# Patient Record
Sex: Female | Born: 1965 | Race: White | Hispanic: No | Marital: Married
Health system: Southern US, Community
[De-identification: ages and names within clinical notes are randomized; demographics above are authoritative.]

---

## 1998-05-19 DIAGNOSIS — C50919 Malignant neoplasm of unspecified site of unspecified female breast: Secondary | ICD-10-CM

## 1998-05-19 DIAGNOSIS — Z9221 Personal history of antineoplastic chemotherapy: Secondary | ICD-10-CM

## 1998-05-19 DIAGNOSIS — Z923 Personal history of irradiation: Secondary | ICD-10-CM

## 1998-05-19 HISTORY — DX: Personal history of irradiation: Z92.3

## 1998-05-19 HISTORY — DX: Personal history of antineoplastic chemotherapy: Z92.21

## 1998-05-19 HISTORY — PX: BREAST LUMPECTOMY: SHX2

## 1998-05-19 HISTORY — DX: Malignant neoplasm of unspecified site of unspecified female breast: C50.919

## 2018-05-19 DIAGNOSIS — C4490 Unspecified malignant neoplasm of skin, unspecified: Secondary | ICD-10-CM

## 2018-05-19 HISTORY — DX: Unspecified malignant neoplasm of skin, unspecified: C44.90

## 2019-08-27 ENCOUNTER — Ambulatory Visit: Payer: Self-pay | Attending: Oncology

## 2019-08-27 DIAGNOSIS — Z23 Encounter for immunization: Secondary | ICD-10-CM

## 2019-08-27 NOTE — Progress Notes (Signed)
   Covid-19 Vaccination Clinic  Name:  Megan Valdez    MRN: [id number] DOB: 18-Oct-1965  08/27/2019  Megan Valdez was observed post Covid-19 immunization for 15 minutes without incident. She was provided with Vaccine Information Sheet and instruction to access the V-Safe system.   Megan Valdez was instructed to call 911 with any severe reactions post vaccine: Marland Kitchen Difficulty breathing  . Swelling of face and throat  . A fast heartbeat  . A bad rash all over body  . Dizziness and weakness   Immunizations Administered    Name Date Dose VIS Date Route   Pfizer COVID-19 Vaccine 08/27/2019  1:33 PM 0.3 mL 04/29/2019 Intramuscular   Manufacturer: [place]   Lot: [id number]   [place]: [id number]

## 2019-09-20 ENCOUNTER — Ambulatory Visit: Payer: Self-pay | Attending: Internal Medicine

## 2019-09-20 DIAGNOSIS — Z23 Encounter for immunization: Secondary | ICD-10-CM

## 2019-09-20 NOTE — Progress Notes (Signed)
   Covid-19 Vaccination Clinic  Name:  Quinnlan Moussa    MRN: [id number] DOB: [birth year]  09/20/2019  Ms. Benney was observed post Covid-19 immunization for 15 minutes without incident. She was provided with Vaccine Information Sheet and instruction to access the V-Safe system.   Ms. Curless was instructed to call 911 with any severe reactions post vaccine: Marland Kitchen Difficulty breathing  . Swelling of face and throat  . A fast heartbeat  . A bad rash all over body  . Dizziness and weakness   Immunizations Administered    Name Date Dose VIS Date Route   Pfizer COVID-19 Vaccine 09/20/2019  3:18 PM 0.3 mL 07/13/2018 Intramuscular   Manufacturer: [place]   Lot: [id number]   [place]: [id number]

## 2020-02-01 ENCOUNTER — Encounter: Payer: Self-pay | Admitting: Dermatology

## 2020-02-01 ENCOUNTER — Ambulatory Visit (INDEPENDENT_AMBULATORY_CARE_PROVIDER_SITE_OTHER): Payer: PRIVATE HEALTH INSURANCE | Admitting: Dermatology

## 2020-02-01 ENCOUNTER — Ambulatory Visit: Payer: Self-pay | Admitting: Dermatology

## 2020-02-01 ENCOUNTER — Other Ambulatory Visit: Payer: Self-pay

## 2020-02-01 DIAGNOSIS — Z85828 Personal history of other malignant neoplasm of skin: Secondary | ICD-10-CM

## 2020-02-01 DIAGNOSIS — L821 Other seborrheic keratosis: Secondary | ICD-10-CM

## 2020-02-01 DIAGNOSIS — L905 Scar conditions and fibrosis of skin: Secondary | ICD-10-CM

## 2020-02-01 DIAGNOSIS — Z1283 Encounter for screening for malignant neoplasm of skin: Secondary | ICD-10-CM | POA: Diagnosis not present

## 2020-02-01 DIAGNOSIS — L820 Inflamed seborrheic keratosis: Secondary | ICD-10-CM

## 2020-02-01 DIAGNOSIS — L814 Other melanin hyperpigmentation: Secondary | ICD-10-CM

## 2020-02-01 DIAGNOSIS — D1800 Hemangioma unspecified site: Secondary | ICD-10-CM

## 2020-02-01 DIAGNOSIS — L578 Other skin changes due to chronic exposure to nonionizing radiation: Secondary | ICD-10-CM

## 2020-02-01 DIAGNOSIS — D229 Melanocytic nevi, unspecified: Secondary | ICD-10-CM | POA: Diagnosis not present

## 2020-02-01 NOTE — Progress Notes (Signed)
New Patient Visit  Subjective  Megan Valdez is a 54 y.o. female who presents for the following: New Patient (Initial Visit).  Patient here for full body skin exam and skin cancer screening.  She has some spots on the body present for months to years. They are asymptomatic and have not been treated previously.   Patient has no particular areas of concern today. She states that she has a h/o skin cancer just does not know what type just that they were removed from 3 areas on her chest, the last time was about 1 1/2  years ago. Patient states that the areas removed were pink and scaly and were surgically removed in [place].  Patient has a history of breast cancer.  The following portions of the chart were reviewed this encounter and updated as appropriate:  Allergies  Meds  Problems  Med Hx  Surg Hx  Fam Hx      Review of Systems:  No other skin or systemic complaints except as noted in HPI or Assessment and Plan.  Objective  Well appearing patient in no apparent distress; mood and affect are within normal limits.  A full examination was performed including scalp, head, eyes, ears, nose, lips, neck, chest, axillae, abdomen, back, buttocks, bilateral upper extremities, bilateral lower extremities, hands, feet, fingers, toes, fingernails, and toenails. All findings within normal limits unless otherwise noted below.  Objective  Left Breast: Dyspigmented smooth macule or patch.   Objective  Right Chest superior to scar, Right Lateral Breast: Erythematous keratotic or waxy stuck-on papule or plaque.    Assessment & Plan  Scar Left Breast  Scar from Port placement.  Observe.  Inflamed seborrheic keratosis (2) Right Chest superior to scar; Right Lateral Breast  Cryotherapy today Prior to procedure, discussed risks of blister formation, small wound, skin dyspigmentation, or rare scar following cryotherapy.    Destruction of lesion - Right Chest superior to scar, Right  Lateral Breast  Destruction method: cryotherapy   Informed consent: discussed and consent obtained   Lesion destroyed using liquid nitrogen: Yes   Outcome: patient tolerated procedure well with no complications   Post-procedure details: wound care instructions given     Lentigines - Scattered tan macules - Discussed BBL treatment with our aesthetician for cosmetic improvement. - Discussed due to sun exposure - Benign, observe - Call for any changes  Seborrheic Keratoses - Stuck-on, waxy, tan-brown papules and plaques  - Discussed benign etiology and prognosis. - Observe - Call for any changes  Melanocytic Nevi - Tan-brown and/or pink-flesh-colored symmetric macules and papules - Benign appearing on exam today - Observation - Call clinic for new or changing moles - Recommend daily use of broad spectrum spf 30+ sunscreen to sun-exposed areas.   Hemangiomas - Red papules - Discussed benign nature - Observe - Call for any changes  Actinic Damage - diffuse scaly erythematous macules with underlying dyspigmentation - Recommend daily broad spectrum sunscreen SPF 30+ to sun-exposed areas, reapply every 2 hours as needed.  - Call for new or changing lesions.  Skin cancer screening performed today.  History of Skin Cancer (unknown type) - Sites clear. Observe for recurrence.  -No lymphadenopathy  - Call clinic for new or changing lesions.   - Recommend regular skin exams, daily broad-spectrum spf 30+ sunscreen use, and photoprotection.    - Will request records  Return in about 6 months (around 07/31/2020) for TBSE.  I, Donzetta Kohut, CMA, am acting as scribe for Forest Gleason, MD .  Documentation: I have reviewed the above documentation for accuracy and completeness, and I agree with the above.  Forest Gleason, MD

## 2020-02-01 NOTE — Patient Instructions (Addendum)
Recommend daily broad spectrum sunscreen SPF 30+ to sun-exposed areas, reapply every 2 hours as needed. Call for new or changing lesions.  Recommend taking Heliocare sun protection supplement daily in sunny weather for additional sun protection. For maximum protection on the sunniest days, you can take up to 2 capsules of regular Heliocare OR take 1 capsule of Heliocare Ultra. For prolonged exposure (such as a full day in the sun), you can repeat your dose of the supplement 4 hours after your first dose. Heliocare can be purchased at [facility] or at [email or web address].   

## 2020-02-13 ENCOUNTER — Encounter: Payer: Self-pay | Admitting: Dermatology

## 2020-03-15 ENCOUNTER — Ambulatory Visit: Payer: PRIVATE HEALTH INSURANCE | Admitting: Dermatology

## 2020-04-06 ENCOUNTER — Other Ambulatory Visit: Payer: Self-pay | Admitting: Internal Medicine

## 2020-04-16 ENCOUNTER — Other Ambulatory Visit: Payer: Self-pay | Admitting: Internal Medicine

## 2020-04-16 DIAGNOSIS — Z1231 Encounter for screening mammogram for malignant neoplasm of breast: Secondary | ICD-10-CM

## 2020-06-11 ENCOUNTER — Ambulatory Visit
Admission: RE | Admit: 2020-06-11 | Discharge: 2020-06-11 | Disposition: A | Discharge status: Home / Self Care | Payer: 59 | Source: Ambulatory Visit | Attending: Internal Medicine | Admitting: Internal Medicine

## 2020-06-11 ENCOUNTER — Other Ambulatory Visit: Payer: Self-pay

## 2020-06-11 DIAGNOSIS — Z1231 Encounter for screening mammogram for malignant neoplasm of breast: Secondary | ICD-10-CM | POA: Insufficient documentation

## 2020-08-01 ENCOUNTER — Encounter: Payer: PRIVATE HEALTH INSURANCE | Admitting: Dermatology

## 2020-11-15 ENCOUNTER — Ambulatory Visit: Payer: PRIVATE HEALTH INSURANCE | Admitting: Dermatology

## 2020-11-15 ENCOUNTER — Other Ambulatory Visit: Payer: Self-pay

## 2020-11-15 DIAGNOSIS — D229 Melanocytic nevi, unspecified: Secondary | ICD-10-CM

## 2020-11-15 DIAGNOSIS — Z85828 Personal history of other malignant neoplasm of skin: Secondary | ICD-10-CM

## 2020-11-15 DIAGNOSIS — L578 Other skin changes due to chronic exposure to nonionizing radiation: Secondary | ICD-10-CM | POA: Diagnosis not present

## 2020-11-15 DIAGNOSIS — L308 Other specified dermatitis: Secondary | ICD-10-CM | POA: Diagnosis not present

## 2020-11-15 DIAGNOSIS — Z1283 Encounter for screening for malignant neoplasm of skin: Secondary | ICD-10-CM

## 2020-11-15 DIAGNOSIS — L821 Other seborrheic keratosis: Secondary | ICD-10-CM

## 2020-11-15 DIAGNOSIS — L814 Other melanin hyperpigmentation: Secondary | ICD-10-CM

## 2020-11-15 DIAGNOSIS — D1800 Hemangioma unspecified site: Secondary | ICD-10-CM

## 2020-11-15 MED ORDER — TRIAMCINOLONE ACETONIDE 0.1 % EX CREA: 1.0000 "application " | TOPICAL_CREAM | Freq: Two times a day (BID) | CUTANEOUS | 2 refills | Status: DC

## 2020-11-15 NOTE — Patient Instructions (Addendum)
Start TMC 0.1% cream twice daily for up to 2 weeks as needed for rash/eczema. Avoid applying to face, groin, and axilla. Use as directed. Risk of skin atrophy with long-term use reviewed.   Topical steroids (such as triamcinolone, fluocinolone, fluocinonide, mometasone, clobetasol, halobetasol, betamethasone, hydrocortisone) can cause thinning and lightening of the skin if they are used for too long in the same area. Your physician has selected the right strength medicine for your problem and area affected on the body. Please use your medication only as directed by your physician to prevent side effects.   Recommend daily broad spectrum sunscreen SPF 30+ to sun-exposed areas, reapply every 2 hours as needed. Call for new or changing lesions.  Staying in the shade or wearing long sleeves, sun glasses (UVA+UVB protection) and wide brim hats (4-inch brim around the entire circumference of the hat) are also recommended for sun protection.   Recommend taking Heliocare sun protection supplement daily in sunny weather for additional sun protection. For maximum protection on the sunniest days, you can take up to 2 capsules of regular Heliocare OR take 1 capsule of Heliocare Ultra. For prolonged exposure (such as a full day in the sun), you can repeat your dose of the supplement 4 hours after your first dose. Heliocare can be purchased at [facility] or at [email or web address].    Melanoma ABCDEs  Melanoma is the most dangerous type of skin cancer, and is the leading cause of death from skin disease.  You are more likely to develop melanoma if you: Have light-colored skin, light-colored eyes, or red or blond hair Spend a lot of time in the sun Tan regularly, either outdoors or in a tanning bed Have had blistering sunburns, especially during childhood Have a close family member who has had a melanoma Have atypical moles or large birthmarks  Early detection of melanoma is key since treatment is  typically straightforward and cure rates are extremely high if we catch it early.   The first sign of melanoma is often a change in a mole or a new dark spot.  The ABCDE system is a way of remembering the signs of melanoma.  A for asymmetry:  The two halves do not match. B for border:  The edges of the growth are irregular. C for color:  A mixture of colors are present instead of an even brown color. D for diameter:  Melanomas are usually (but not always) greater than 9mm - the size of a pencil eraser. E for evolution:  The spot keeps changing in size, shape, and color.  Please check your skin once per month between visits. You can use a small mirror in front and a large mirror behind you to keep an eye on the back side or your body.   If you see any new or changing lesions before your next follow-up, please call to schedule a visit.  Please continue daily skin protection including broad spectrum sunscreen SPF 30+ to sun-exposed areas, reapplying every 2 hours as needed when you're outdoors.    If you have any questions or concerns for your doctor, please call our main line at [phone] and press option 4 to reach your doctor's medical assistant. If no one answers, please leave a voicemail as directed and we will return your call as soon as possible. Messages left after 4 pm will be answered the following business day.   You may also send [place] a message via [place]. We typically respond to MyChart messages within  1-2 business days.  For prescription refills, please ask your pharmacy to contact our office. Our fax number is [phone].  If you have an urgent issue when the clinic is closed that cannot wait until the next business day, you can page your doctor at the number below.    Please note that while we do our best to be available for urgent issues outside of office hours, we are not available 24/7.   If you have an urgent issue and are unable to reach [place], you may choose to seek  medical care at your doctor's office, retail clinic, urgent care center, or emergency room.  If you have a medical emergency, please immediately call 911 or go to the emergency department.  Pager Numbers  - Dr. Nehemiah Massed: [phone]  - Dr. Laurence Ferrari: [phone]  - Dr. Nicole Kindred: [phone]  In the event of inclement weather, please call our main line at [phone] for an update on the status of any delays or closures.  Dermatology Medication Tips: Please keep the boxes that topical medications come in in order to help keep track of the instructions about where and how to use these. Pharmacies typically print the medication instructions only on the boxes and not directly on the medication tubes.   If your medication is too expensive, please contact our office at [phone] option 4 or send [place] a message through [place].   We are unable to tell what your co-pay for medications will be in advance as this is different depending on your insurance coverage. However, we may be able to find a substitute medication at lower cost or fill out paperwork to get insurance to cover a needed medication.   If a prior authorization is required to get your medication covered by your insurance company, please allow [place] 1-2 business days to complete this process.  Drug prices often vary depending on where the prescription is filled and some pharmacies may offer cheaper prices.  The website www.goodrx.com contains coupons for medications through different pharmacies. The prices here do not account for what the cost may be with help from insurance (it may be cheaper with your insurance), but the website can give you the price if you did not use any insurance.  - You can print the associated coupon and take it with your prescription to the pharmacy.  - You may also stop by our office during regular business hours and pick up a GoodRx coupon card.  - If you need your prescription sent electronically to a  different pharmacy, notify our office through [facility] or by phone at [phone] option 4.

## 2020-11-15 NOTE — Progress Notes (Signed)
Follow-Up Visit   Subjective  Megan Valdez is a 55 y.o. female who presents for the following: FBSE (Patient here for full body skin exam and skin cancer screening. Patient with hx of skin cancer at chest treated in [place] about 4-5 years ago. There are no new or changing spots that patient is aware of today. ).   She does have a rash at her feet which is itchy.   The following portions of the chart were reviewed this encounter and updated as appropriate:   Allergies  Meds  Problems  Med Hx  Surg Hx  Fam Hx       Review of Systems:  No other skin or systemic complaints except as noted in HPI or Assessment and Plan.  Objective  Well appearing patient in no apparent distress; mood and affect are within normal limits.  A full examination was performed including scalp, head, eyes, ears, nose, lips, neck, chest, axillae, abdomen, back, buttocks, bilateral upper extremities, bilateral lower extremities, hands, feet, fingers, toes, fingernails, and toenails. All findings within normal limits unless otherwise noted below.  bilateral dorsal feet Scaly pink plaques   Assessment & Plan  Other eczema bilateral dorsal feet  KOH negative  Start TMC 0.1% cream twice daily for up to 2 weeks as needed for rash/eczema. Avoid applying to face, groin, and axilla. Use as directed. Risk of skin atrophy with long-term use reviewed.   Topical steroids (such as triamcinolone, fluocinolone, fluocinonide, mometasone, clobetasol, halobetasol, betamethasone, hydrocortisone) can cause thinning and lightening of the skin if they are used for too long in the same area. Your physician has selected the right strength medicine for your problem and area affected on the body. Please use your medication only as directed by your physician to prevent side effects.   If persistent and not responding well to therapy, may consider patch testing   triamcinolone cream (KENALOG) 0.1 % - bilateral dorsal  feet Apply 1 application topically 2 (two) times daily. To affected areas of rash/eczema for up to 2 weeks as needed. Avoid applying to face, groin, and axilla. Use as directed. Risk of skin atrophy with long-term use reviewed.  Lentigines - Scattered tan macules - Due to sun exposure - Benign-appering, observe - Recommend daily broad spectrum sunscreen SPF 30+ to sun-exposed areas, reapply every 2 hours as needed. - Call for any changes  Seborrheic Keratoses - Stuck-on, waxy, tan-brown papules and/or plaques  - Benign-appearing - Discussed benign etiology and prognosis. - Observe - Call for any changes  Melanocytic Nevi - Tan-brown and/or pink-flesh-colored symmetric macules and papules - Benign appearing on exam today - Observation - Call clinic for new or changing moles - Recommend daily use of broad spectrum spf 30+ sunscreen to sun-exposed areas.   Hemangiomas - Red papules - Discussed benign nature - Observe - Call for any changes  Actinic Damage - Chronic condition, secondary to cumulative UV/sun exposure - diffuse scaly erythematous macules with underlying dyspigmentation - Recommend daily broad spectrum sunscreen SPF 30+ to sun-exposed areas, reapply every 2 hours as needed.  - Staying in the shade or wearing long sleeves, sun glasses (UVA+UVB protection) and wide brim hats (4-inch brim around the entire circumference of the hat) are also recommended for sun protection.  - Call for new or changing lesions.  Skin cancer screening performed today.  History of Skin Cancer   Clear. Observe for recurrence.  Call clinic for new or changing lesions.   No cervical, axillary or inguinal lymphadenopathy  Recommend regular skin exams, daily broad-spectrum spf 30+ sunscreen use, and photoprotection.     Return in about 1 year (around 11/15/2021) for TBSE.  Graciella Belton, RMA, am acting as scribe for Forest Gleason, MD .  Documentation: I have reviewed the above  documentation for accuracy and completeness, and I agree with the above.  Forest Gleason, MD

## 2020-11-21 ENCOUNTER — Encounter: Payer: Self-pay | Admitting: Dermatology

## 2021-05-01 ENCOUNTER — Other Ambulatory Visit: Payer: Self-pay | Admitting: Internal Medicine

## 2021-05-01 DIAGNOSIS — Z1231 Encounter for screening mammogram for malignant neoplasm of breast: Secondary | ICD-10-CM

## 2021-06-12 ENCOUNTER — Other Ambulatory Visit: Payer: Self-pay

## 2021-06-12 ENCOUNTER — Ambulatory Visit
Admission: RE | Admit: 2021-06-12 | Discharge: 2021-06-12 | Disposition: A | Discharge status: Home / Self Care | Payer: BC Managed Care – PPO | Source: Ambulatory Visit | Attending: Internal Medicine | Admitting: Internal Medicine

## 2021-06-12 DIAGNOSIS — Z1231 Encounter for screening mammogram for malignant neoplasm of breast: Secondary | ICD-10-CM | POA: Diagnosis present

## 2021-06-17 ENCOUNTER — Other Ambulatory Visit: Payer: Self-pay | Admitting: Internal Medicine

## 2021-06-17 DIAGNOSIS — R928 Other abnormal and inconclusive findings on diagnostic imaging of breast: Secondary | ICD-10-CM

## 2021-06-17 DIAGNOSIS — R921 Mammographic calcification found on diagnostic imaging of breast: Secondary | ICD-10-CM

## 2021-06-18 ENCOUNTER — Ambulatory Visit
Admission: RE | Admit: 2021-06-18 | Discharge: 2021-06-18 | Disposition: A | Discharge status: Home / Self Care | Payer: BC Managed Care – PPO | Source: Ambulatory Visit | Attending: Internal Medicine | Admitting: Internal Medicine

## 2021-06-18 ENCOUNTER — Other Ambulatory Visit: Payer: Self-pay

## 2021-06-18 DIAGNOSIS — R928 Other abnormal and inconclusive findings on diagnostic imaging of breast: Secondary | ICD-10-CM | POA: Insufficient documentation

## 2021-06-18 DIAGNOSIS — R921 Mammographic calcification found on diagnostic imaging of breast: Secondary | ICD-10-CM | POA: Insufficient documentation

## 2021-06-20 ENCOUNTER — Other Ambulatory Visit: Payer: Self-pay | Admitting: Internal Medicine

## 2021-06-20 DIAGNOSIS — R928 Other abnormal and inconclusive findings on diagnostic imaging of breast: Secondary | ICD-10-CM

## 2021-06-20 DIAGNOSIS — R921 Mammographic calcification found on diagnostic imaging of breast: Secondary | ICD-10-CM

## 2021-06-27 ENCOUNTER — Ambulatory Visit
Admission: RE | Admit: 2021-06-27 | Discharge: 2021-06-27 | Disposition: A | Discharge status: Home / Self Care | Payer: BC Managed Care – PPO | Source: Ambulatory Visit | Attending: Internal Medicine | Admitting: Internal Medicine

## 2021-06-27 ENCOUNTER — Other Ambulatory Visit: Payer: Self-pay

## 2021-06-27 DIAGNOSIS — R928 Other abnormal and inconclusive findings on diagnostic imaging of breast: Secondary | ICD-10-CM | POA: Diagnosis present

## 2021-06-27 DIAGNOSIS — R921 Mammographic calcification found on diagnostic imaging of breast: Secondary | ICD-10-CM | POA: Diagnosis present

## 2021-06-27 HISTORY — PX: BREAST BIOPSY: SHX20

## 2021-06-28 LAB — SURGICAL PATHOLOGY

## 2021-07-01 ENCOUNTER — Ambulatory Visit: Payer: BC Managed Care – PPO

## 2021-11-27 ENCOUNTER — Encounter: Payer: Self-pay | Admitting: Dermatology

## 2021-11-27 ENCOUNTER — Ambulatory Visit: Payer: BC Managed Care – PPO | Admitting: Dermatology

## 2021-11-27 DIAGNOSIS — L578 Other skin changes due to chronic exposure to nonionizing radiation: Secondary | ICD-10-CM | POA: Diagnosis not present

## 2021-11-27 DIAGNOSIS — L111 Transient acantholytic dermatosis [Grover]: Secondary | ICD-10-CM

## 2021-11-27 DIAGNOSIS — L814 Other melanin hyperpigmentation: Secondary | ICD-10-CM

## 2021-11-27 DIAGNOSIS — D1800 Hemangioma unspecified site: Secondary | ICD-10-CM

## 2021-11-27 DIAGNOSIS — Z1283 Encounter for screening for malignant neoplasm of skin: Secondary | ICD-10-CM | POA: Diagnosis not present

## 2021-11-27 DIAGNOSIS — L821 Other seborrheic keratosis: Secondary | ICD-10-CM

## 2021-11-27 DIAGNOSIS — D229 Melanocytic nevi, unspecified: Secondary | ICD-10-CM

## 2021-11-27 DIAGNOSIS — Z85828 Personal history of other malignant neoplasm of skin: Secondary | ICD-10-CM

## 2021-11-27 NOTE — Patient Instructions (Addendum)
Recommend taking Heliocare sun protection supplement daily in sunny weather for additional sun protection. For maximum protection on the sunniest days, you can take up to 2 capsules of regular Heliocare OR take 1 capsule of Heliocare Ultra. For prolonged exposure (such as a full day in the sun), you can repeat your dose of the supplement 4 hours after your first dose. Heliocare can be purchased at [employer], at some [facility] or at [email or web address].    Recommend Niacinamide or Nicotinamide '500mg'$  twice per day to lower risk of non-melanoma skin cancer by approximately 25%. This is usually available at [employer].   Melanoma ABCDEs  Melanoma is the most dangerous type of skin cancer, and is the leading cause of death from skin disease.  You are more likely to develop melanoma if you: Have light-colored skin, light-colored eyes, or red or blond hair Spend a lot of time in the sun Tan regularly, either outdoors or in a tanning bed Have had blistering sunburns, especially during childhood Have a close family member who has had a melanoma Have atypical moles or large birthmarks  Early detection of melanoma is key since treatment is typically straightforward and cure rates are extremely high if we catch it early.   The first sign of melanoma is often a change in a mole or a new dark spot.  The ABCDE system is a way of remembering the signs of melanoma.  A for asymmetry:  The two halves do not match. B for border:  The edges of the growth are irregular. C for color:  A mixture of colors are present instead of an even brown color. D for diameter:  Melanomas are usually (but not always) greater than 45m - the size of a pencil eraser. E for evolution:  The spot keeps changing in size, shape, and color.  Please check your skin once per month between visits. You can use a small mirror in front and a large mirror behind you to keep an eye on the back side or your body.   If you see any  new or changing lesions before your next follow-up, please call to schedule a visit.  Please continue daily skin protection including broad spectrum sunscreen SPF 30+ to sun-exposed areas, reapplying every 2 hours as needed when you're outdoors.    Due to recent changes in healthcare laws, you may see results of your pathology and/or laboratory studies on MyChart before the doctors have had a chance to review them. We understand that in some cases there may be results that are confusing or concerning to you. Please understand that not all results are received at the same time and often the doctors may need to interpret multiple results in order to provide you with the best plan of care or course of treatment. Therefore, we ask that you please give u[place]2 business days to thoroughly review all your results before contacting the office for clarification. Should we see a critical lab result, you will be contacted sooner.   If You Need Anything After Your Visit  If you have any questions or concerns for your doctor, please call our main line at [phone] press option 4 to reach your doctor's medical assistant. If no one answers, please leave a voicemail as directed and we will return your call as soon as possible. Messages left after 4 pm will be answered the following business day.   You may also send [place] message via [place] We typically respond to  MyChart messages within 1-2 business days.  For prescription refills, please ask your pharmacy to contact our office. Our fax number is [phone].  If you have an urgent issue when the clinic is closed that cannot wait until the next business day, you can page your doctor at the number below.    Please note that while we do our best to be available for urgent issues outside of office hours, we are not available 24/7.   If you have an urgent issue and are unable to reach [place], you may choose to seek medical care at your doctor's office, retail  clinic, urgent care center, or emergency room.  If you have a medical emergency, please immediately call 911 or go to the emergency department.  Pager Numbers  - Dr. Nehemiah Massed: [phone]  - Dr. Laurence Ferrari: [phone]  - Dr. Nicole Kindred: [phone]  In the event of inclement weather, please call our main line at [phone] for an update on the status of any delays or closures.  Dermatology Medication Tips: Please keep the boxes that topical medications come in in order to help keep track of the instructions about where and how to use these. Pharmacies typically print the medication instructions only on the boxes and not directly on the medication tubes.   If your medication is too expensive, please contact our office at [phone] option 4 or send [place] a message through [place].   We are unable to tell what your co-pay for medications will be in advance as this is different depending on your insurance coverage. However, we may be able to find a substitute medication at lower cost or fill out paperwork to get insurance to cover a needed medication.   If a prior authorization is required to get your medication covered by your insurance company, please allow [place] 1-2 business days to complete this process.  Drug prices often vary depending on where the prescription is filled and some pharmacies may offer cheaper prices.  The website www.goodrx.com contains coupons for medications through different pharmacies. The prices here do not account for what the cost may be with help from insurance (it may be cheaper with your insurance), but the website can give you the price if you did not use any insurance.  - You can print the associated coupon and take it with your prescription to the pharmacy.  - You may also stop by our office during regular business hours and pick up a GoodRx coupon card.  - If you need your prescription sent electronically to a different pharmacy, notify our office through [facility] or by phone at [phone] option 4.     Si Usted Necesita Algo Despus de Su Visita  Tambin puede enviarnos un mensaje a travs de Pharmacist, community. Por lo general respondemos a los mensajes de MyChart en el transcurso de 1 a 2 das hbiles.  Para renovar recetas, por favor pida a su farmacia que se ponga en contacto con nuestra oficina. Harland Dingwall de fax es [place] [phone].  Si tiene un asunto urgente cuando la clnica est cerrada y que no puede esperar hasta el siguiente da hbil, puede llamar/localizar a su doctor(a) al nmero que aparece a continuacin.   Por favor, tenga en cuenta que aunque hacemos todo lo posible para estar disponibles para asuntos urgentes fuera del horario de [place], no estamos disponibles las 24 horas del da, los 7 das de la [place].   Si tiene un problema urgente y no puede comunicarse con nosotros, puede  optar por buscar atencin mdica  en el consultorio de su doctor(a), en una clnica privada, en un centro de atencin urgente o en una sala de emergencias.  Si tiene Engineering geologist, por favor llame inmediatamente al 911 o vaya a la sala de emergencias.  Nmeros de bper  - Dr. Nehemiah Massed: [phone]  - Dra. Moye: [phone]  - Dra. Nicole Kindred: [phone]  En caso de inclemencias del [place], por favor llame a Johnsie Kindred principal al [phone] para una actualizacin sobre el [place] de cualquier retraso o cierre.  Consejos para la medicacin en dermatologa: Por favor, guarde las cajas en las que vienen los medicamentos de uso tpico para ayudarle a seguir las instrucciones sobre dnde y cmo usarlos. Las farmacias generalmente imprimen las instrucciones del medicamento slo en las cajas y no directamente en los tubos del [place].   Si su medicamento es muy caro, por favor, pngase en contacto con Zigmund Daniel llamando al [phone] y presione la opcin 4 o envenos un mensaje a travs de Pharmacist, community.   No podemos decirle  cul ser su copago por los medicamentos por adelantado ya que esto es diferente dependiendo de la cobertura de su seguro. Sin embargo, es posible que podamos encontrar un medicamento sustituto a Electrical engineer un formulario para que el seguro cubra el medicamento que se considera necesario.   Si se requiere una autorizacin previa para que su compaa de seguros [place] su medicamento, por favor permtanos de 1 a 2 das hbiles para completar este proceso.  Los precios de los medicamentos varan con frecuencia dependiendo del Environmental consultant de dnde se surte la receta y alguna farmacias pueden ofrecer precios ms baratos.  El sitio web www.goodrx.com tiene cupones para medicamentos de Airline pilot. Los precios aqu no tienen en cuenta lo que podra costar con la ayuda del seguro (puede ser ms barato con su seguro), pero el sitio web puede darle el precio si no utiliz Research scientist (physical sciences).  - Puede imprimir el cupn correspondiente y llevarlo con su receta a la farmacia.  - Tambin puede pasar por nuestra oficina durante el horario de atencin regular y Charity fundraiser una tarjeta de cupones de GoodRx.  - Si necesita que su receta se enve electrnicamente a una farmacia diferente, informe a nuestra oficina a travs de MyChart de [place] o por telfono llamando al [phone] y presione la opcin 4.

## 2021-11-27 NOTE — Progress Notes (Signed)
   Follow-Up Visit   Subjective  Megan Valdez is a 56 y.o. female who presents for the following: Annual Exam (The patient presents for Total-Body Skin Exam (TBSE) for skin cancer screening and mole check.  The patient has spots, moles and lesions to be evaluated, some may be new or changing and the patient has concerns that these could be cancer. Patient with hx of skin cancer. ).   The following portions of the chart were reviewed this encounter and updated as appropriate:   Allergies  Meds  Problems  Med Hx  Surg Hx  Fam Hx      Review of Systems:  No other skin or systemic complaints except as noted in HPI or Assessment and Plan.  Objective  Well appearing patient in no apparent distress; mood and affect are within normal limits.  A full examination was performed including scalp, head, eyes, ears, nose, lips, neck, chest, axillae, abdomen, back, buttocks, bilateral upper extremities, bilateral lower extremities, hands, feet, fingers, toes, fingernails, and toenails. All findings within normal limits unless otherwise noted below.  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   Benign condition.  Defer treatment.  Call if worsens or changes.   Lentigines - Scattered tan macules - Due to sun exposure - Benign-appearing, observe - Recommend daily broad spectrum sunscreen SPF 30+ to sun-exposed areas, reapply every 2 hours as needed. - Call for any changes  Seborrheic Keratoses - Stuck-on, waxy, tan-brown papules and/or plaques  - Benign-appearing - Discussed benign etiology and prognosis. - Observe - Call for any changes  Melanocytic Nevi - Tan-brown and/or pink-flesh-colored symmetric macules and papules - Benign appearing on exam today - Observation - Call clinic for new or changing moles - Recommend daily use of broad spectrum spf 30+ sunscreen to sun-exposed areas.   Hemangiomas -  Red papules - Discussed benign nature - Observe - Call for any changes  Actinic Damage - Chronic condition, secondary to cumulative UV/sun exposure - diffuse scaly erythematous macules with underlying dyspigmentation - Recommend daily broad spectrum sunscreen SPF 30+ to sun-exposed areas, reapply every 2 hours as needed.  - Staying in the shade or wearing long sleeves, sun glasses (UVA+UVB protection) and wide brim hats (4-inch brim around the entire circumference of the hat) are also recommended for sun protection.  - Call for new or changing lesions.  Skin cancer screening performed today.  History of Skin Cancer   Clear. Observe for recurrence.  No lymphadenopathy.  Call clinic for new or changing lesions.   Recommend regular skin exams, daily broad-spectrum spf 30+ sunscreen use, and photoprotection.     Return for 6-12 months , TBSE.  Graciella Belton, RMA, am acting as scribe for Forest Gleason, MD .  Documentation: I have reviewed the above documentation for accuracy and completeness, and I agree with the above.  Forest Gleason, MD

## 2021-12-08 ENCOUNTER — Encounter: Payer: Self-pay | Admitting: Dermatology

## 2022-04-07 ENCOUNTER — Other Ambulatory Visit: Payer: Self-pay | Admitting: Internal Medicine

## 2022-04-07 DIAGNOSIS — Z1231 Encounter for screening mammogram for malignant neoplasm of breast: Secondary | ICD-10-CM

## 2022-06-13 ENCOUNTER — Ambulatory Visit
Admission: RE | Admit: 2022-06-13 | Discharge: 2022-06-13 | Disposition: A | Discharge status: Home / Self Care | Payer: BC Managed Care – PPO | Source: Ambulatory Visit | Attending: Internal Medicine | Admitting: Internal Medicine

## 2022-06-13 DIAGNOSIS — Z1231 Encounter for screening mammogram for malignant neoplasm of breast: Secondary | ICD-10-CM | POA: Diagnosis present

## 2022-06-18 ENCOUNTER — Other Ambulatory Visit: Payer: Self-pay | Admitting: Internal Medicine

## 2022-06-18 ENCOUNTER — Ambulatory Visit
Admission: RE | Admit: 2022-06-18 | Discharge: 2022-06-18 | Disposition: A | Discharge status: Home / Self Care | Payer: BC Managed Care – PPO | Source: Ambulatory Visit | Attending: Internal Medicine | Admitting: Internal Medicine

## 2022-06-18 DIAGNOSIS — R928 Other abnormal and inconclusive findings on diagnostic imaging of breast: Secondary | ICD-10-CM

## 2022-06-18 DIAGNOSIS — R921 Mammographic calcification found on diagnostic imaging of breast: Secondary | ICD-10-CM

## 2022-06-24 ENCOUNTER — Other Ambulatory Visit: Payer: Self-pay | Admitting: Internal Medicine

## 2022-06-24 DIAGNOSIS — R921 Mammographic calcification found on diagnostic imaging of breast: Secondary | ICD-10-CM

## 2022-06-24 DIAGNOSIS — R928 Other abnormal and inconclusive findings on diagnostic imaging of breast: Secondary | ICD-10-CM

## 2022-06-26 ENCOUNTER — Ambulatory Visit
Admission: RE | Admit: 2022-06-26 | Discharge: 2022-06-26 | Disposition: A | Discharge status: Home / Self Care | Payer: BC Managed Care – PPO | Source: Ambulatory Visit | Attending: Internal Medicine | Admitting: Internal Medicine

## 2022-06-26 DIAGNOSIS — R928 Other abnormal and inconclusive findings on diagnostic imaging of breast: Secondary | ICD-10-CM | POA: Diagnosis present

## 2022-06-26 DIAGNOSIS — R921 Mammographic calcification found on diagnostic imaging of breast: Secondary | ICD-10-CM | POA: Insufficient documentation

## 2022-06-26 HISTORY — PX: BREAST BIOPSY: SHX20

## 2022-06-26 MED ORDER — LIDOCAINE HCL (PF) 1 % IJ SOLN
5.0000 mL | Freq: Once | INTRAMUSCULAR | Status: AC
  Administered 2022-06-26: 5 mL

## 2022-06-26 MED ORDER — LIDOCAINE-EPINEPHRINE (PF) 1 %-1:200000 IJ SOLN
20.0000 mL | Freq: Once | INTRAMUSCULAR | Status: AC
  Administered 2022-06-26: 20 mL

## 2022-06-27 LAB — SURGICAL PATHOLOGY

## 2022-12-03 ENCOUNTER — Encounter: Payer: BC Managed Care – PPO | Admitting: Dermatology

## 2022-12-22 ENCOUNTER — Encounter: Payer: Self-pay | Admitting: Dermatology

## 2022-12-22 ENCOUNTER — Ambulatory Visit (INDEPENDENT_AMBULATORY_CARE_PROVIDER_SITE_OTHER): Payer: BC Managed Care – PPO | Admitting: Dermatology

## 2022-12-22 VITALS — BP 111/72 | HR 58

## 2022-12-22 DIAGNOSIS — L308 Other specified dermatitis: Secondary | ICD-10-CM

## 2022-12-22 DIAGNOSIS — D229 Melanocytic nevi, unspecified: Secondary | ICD-10-CM

## 2022-12-22 DIAGNOSIS — Z85828 Personal history of other malignant neoplasm of skin: Secondary | ICD-10-CM

## 2022-12-22 DIAGNOSIS — Z1283 Encounter for screening for malignant neoplasm of skin: Secondary | ICD-10-CM

## 2022-12-22 DIAGNOSIS — D1801 Hemangioma of skin and subcutaneous tissue: Secondary | ICD-10-CM | POA: Diagnosis not present

## 2022-12-22 DIAGNOSIS — L239 Allergic contact dermatitis, unspecified cause: Secondary | ICD-10-CM

## 2022-12-22 DIAGNOSIS — L821 Other seborrheic keratosis: Secondary | ICD-10-CM

## 2022-12-22 DIAGNOSIS — L814 Other melanin hyperpigmentation: Secondary | ICD-10-CM | POA: Diagnosis not present

## 2022-12-22 DIAGNOSIS — L578 Other skin changes due to chronic exposure to nonionizing radiation: Secondary | ICD-10-CM

## 2022-12-22 DIAGNOSIS — W908XXA Exposure to other nonionizing radiation, initial encounter: Secondary | ICD-10-CM

## 2022-12-22 DIAGNOSIS — Z808 Family history of malignant neoplasm of other organs or systems: Secondary | ICD-10-CM

## 2022-12-22 MED ORDER — TRIAMCINOLONE ACETONIDE 0.1 % EX CREA: 1.0000 | TOPICAL_CREAM | Freq: Two times a day (BID) | CUTANEOUS | 2 refills | Status: AC

## 2022-12-22 NOTE — Progress Notes (Signed)
Follow-Up Visit   Subjective  Megan Valdez is a 57 y.o. female who presents for the following: Skin Cancer Screening and Full Body Skin Exam. Hx of NMSC in 2020. Father Hx of NMSC. Sister Hx of MM at age 56.  3 red patches showed up over the weekend. Chest, back, right hip. Mildly itchy. Has been working out in the yard.  History of eczema. Would like refill of Triamcinolone cream for flares. Controls well.   The patient presents for Total-Body Skin Exam (TBSE) for skin cancer screening and mole check. The patient has spots, moles and lesions to be evaluated, some may be new or changing and the patient may have concern these could be cancer.    The following portions of the chart were reviewed this encounter and updated as appropriate: medications, allergies, medical history  Review of Systems:  No other skin or systemic complaints except as noted in HPI or Assessment and Plan.  Objective  Well appearing patient in no apparent distress; mood and affect are within normal limits.  A full examination was performed including scalp, head, eyes, ears, nose, lips, neck, chest, axillae, abdomen, back, buttocks, bilateral upper extremities, bilateral lower extremities, hands, feet, fingers, toes, fingernails, and toenails. All findings within normal limits unless otherwise noted below.   Relevant physical exam findings are noted in the Assessment and Plan.    Assessment & Plan   SKIN CANCER SCREENING PERFORMED TODAY.  ACTINIC DAMAGE - Chronic condition, secondary to cumulative UV/sun exposure - diffuse scaly erythematous macules with underlying dyspigmentation - Recommend daily broad spectrum sunscreen SPF 30+ to sun-exposed areas, reapply every 2 hours as needed.  - Staying in the shade or wearing long sleeves, sun glasses (UVA+UVB protection) and wide brim hats (4-inch brim around the entire circumference of the hat) are also recommended for sun protection.  - Call for new or changing  lesions.  LENTIGINES, SEBORRHEIC KERATOSES, HEMANGIOMAS - Benign normal skin lesions - Benign-appearing - Call for any changes  MELANOCYTIC NEVI - Tan-brown and/or pink-flesh-colored symmetric macules and papules - Benign appearing on exam today - Observation - Call clinic for new or changing moles - Recommend daily use of broad spectrum spf 30+ sunscreen to sun-exposed areas.   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 - Clear. Observe for recurrence.  - Call clinic for new or changing lesions.   - Recommend regular skin exams, daily broad-spectrum spf 30+ sunscreen use, and photoprotection.      ALLERGIC CONTACT DERMATITIS Exam: linear scaly pink papules and/or plaques with edematous vesiculation on left upper chest, right thigh  Suspect secondary to poison ivy or poison oak exposure  Treatment Plan: Triamcinolone 0.1% cream apply twice daily up to 2 weeks. Avoid applying to face, groin, and axilla. Use as directed. Long-term use can cause thinning of the skin.  Topical steroids (such as triamcinolone, fluocinolone, fluocinonide, mometasone, clobetasol, halobetasol, betamethasone, hydrocortisone) can cause thinning and lightening of the skin if they are used for too long in the same area. Your physician has selected the right strength medicine for your problem and area affected on the body. Please use your medication only as directed by your physician to prevent side effects.    FAMILY HISTORY OF SKIN CANCER What type(s): Father and sister  Who affected: NMSC: Father in 83's when diagnosed. Sister Dx with MM on face at age 3    Other eczema   Return in about 1 year (around 12/22/2023) for  TBSE, Hx of skin cancer, non MM.  I, Lawson Radar, CMA, am acting as scribe for Elie Goody, MD.   Documentation: I have reviewed the above documentation for accuracy and completeness, and I agree with the above.  Elie Goody, MD

## 2022-12-22 NOTE — Patient Instructions (Addendum)
Triamcinolone 0.1% cream apply twice daily up to 2 weeks. Avoid applying to face, groin, and axilla. Use as directed. Long-term use can cause thinning of the skin.  Topical steroids (such as triamcinolone, fluocinolone, fluocinonide, mometasone, clobetasol, halobetasol, betamethasone, hydrocortisone) can cause thinning and lightening of the skin if they are used for too long in the same area. Your physician has selected the right strength medicine for your problem and area affected on the body. Please use your medication only as directed by your physician to prevent side effects.    Recommend daily broad spectrum sunscreen SPF 30+ to sun-exposed areas, reapply every 2 hours as needed. Call for new or changing lesions.  Staying in the shade or wearing long sleeves, sun glasses (UVA+UVB protection) and wide brim hats (4-inch brim around the entire circumference of the hat) are also recommended for sun protection.    Melanoma ABCDEs  Melanoma is the most dangerous type of skin cancer, and is the leading cause of death from skin disease.  You are more likely to develop melanoma if you: Have light-colored skin, light-colored eyes, or red or blond hair Spend a lot of time in the sun Tan regularly, either outdoors or in a tanning bed Have had blistering sunburns, especially during childhood Have a close family member who has had a melanoma Have atypical moles or large birthmarks  Early detection of melanoma is key since treatment is typically straightforward and cure rates are extremely high if we catch it early.   The first sign of melanoma is often a change in a mole or a new dark spot.  The ABCDE system is a way of remembering the signs of melanoma.  A for asymmetry:  The two halves do not match. B for border:  The edges of the growth are irregular. C for color:  A mixture of colors are present instead of an even brown color. D for diameter:  Melanomas are usually (but not always) greater than  6mm - the size of a pencil eraser. E for evolution:  The spot keeps changing in size, shape, and color.  Please check your skin once per month between visits. You can use a small mirror in front and a large mirror behind you to keep an eye on the back side or your body.   If you see any new or changing lesions before your next follow-up, please call to schedule a visit.  Please continue daily skin protection including broad spectrum sunscreen SPF 30+ to sun-exposed areas, reapplying every 2 hours as needed when you're outdoors.   Staying in the shade or wearing long sleeves, sun glasses (UVA+UVB protection) and wide brim hats (4-inch brim around the entire circumference of the hat) are also recommended for sun protection.    Due to recent changes in healthcare laws, you may see results of your pathology and/or laboratory studies on MyChart before the doctors have had a chance to review them. We understand that in some cases there may be results that are confusing or concerning to you. Please understand that not all results are received at the same time and often the doctors may need to interpret multiple results in order to provide you with the best plan of care or course of treatment. Therefore, we ask that you please give [place] 2 business days to thoroughly review all your results before contacting the office for clarification. Should we see a critical lab result, you will be contacted sooner.   If You Need Anything After  Your Visit  If you have any questions or concerns for your doctor, please call our main line at [phone] and press option 4 to reach your doctor's medical assistant. If no one answers, please leave a voicemail as directed and we will return your call as soon as possible. Messages left after 4 pm will be answered the following business day.   You may also send [place] a message via MyChart. We typically respond to MyChart messages within 1-2 business days.  For prescription  refills, please ask your pharmacy to contact our office. Our fax number is [phone].  If you have an urgent issue when the clinic is closed that cannot wait until the next business day, you can page your doctor at the number below.    Please note that while we do our best to be available for urgent issues outside of office hours, we are not available 24/7.   If you have an urgent issue and are unable to reach [place], you may choose to seek medical care at your doctor's office, retail clinic, urgent care center, or emergency room.  If you have a medical emergency, please immediately call 911 or go to the emergency department.  Pager Numbers  - Dr. Gwen Pounds: [phone]  - Dr. Roseanne Reno: [phone]  In the event of inclement weather, please call our main line at [phone] for an update on the status of any delays or closures.  Dermatology Medication Tips: Please keep the boxes that topical medications come in in order to help keep track of the instructions about where and how to use these. Pharmacies typically print the medication instructions only on the boxes and not directly on the medication tubes.   If your medication is too expensive, please contact our office at [phone] option 4 or send [place] a message through MyChart.   We are unable to tell what your co-pay for medications will be in advance as this is different depending on your insurance coverage. However, we may be able to find a substitute medication at lower cost or fill out paperwork to get insurance to cover a needed medication.   If a prior authorization is required to get your medication covered by your insurance company, please allow [place] 1-2 business days to complete this process.  Drug prices often vary depending on where the prescription is filled and some pharmacies may offer cheaper prices.  The website www.goodrx.com contains coupons for medications through different pharmacies. The prices here do not account  for what the cost may be with help from insurance (it may be cheaper with your insurance), but the website can give you the price if you did not use any insurance.  - You can print the associated coupon and take it with your prescription to the pharmacy.  - You may also stop by our office during regular business hours and pick up a GoodRx coupon card.  - If you need your prescription sent electronically to a different pharmacy, notify our office through [facility] or by phone at [phone] option 4.     Si Usted Necesita Algo Despus de Su Visita  Tambin puede enviarnos un mensaje a travs de Clinical cytogeneticist. Por lo general respondemos a los mensajes de MyChart en el transcurso de 1 a 2 das hbiles.  Para renovar recetas, por favor pida a su farmacia que se ponga en contacto con nuestra oficina. Annie Sable de fax es [place] [phone].  Si tiene un asunto urgente cuando la clnica est cerrada y  que no puede esperar hasta el siguiente da hbil, puede llamar/localizar a su doctor(a) al nmero que aparece a continuacin.   Por favor, tenga en cuenta que aunque hacemos todo lo posible para estar disponibles para asuntos urgentes fuera del horario de [place], no estamos disponibles las 24 horas del da, los 7 [address]  [address] de la [place].   Si tiene un problema urgente y no puede comunicarse con nosotros, puede optar por buscar atencin mdica  en el consultorio de su doctor(a), en una clnica privada, en un centro de atencin urgente o en una sala de emergencias.  Si tiene Engineer, drilling, por favor llame inmediatamente al 911 o vaya a la sala de emergencias.  Nmeros de bper  - Dr. Gwen Pounds: [phone]  - Dra. Roseanne Reno: [phone]  En caso de inclemencias del [place], por favor llame a Lacy Duverney principal al [phone] para una actualizacin sobre el [place] de cualquier retraso o cierre.  Consejos para la medicacin en dermatologa: Por favor, guarde las cajas en las que  vienen los medicamentos de uso tpico para ayudarle a seguir las instrucciones sobre dnde y cmo usarlos. Las farmacias generalmente imprimen las instrucciones del medicamento slo en las cajas y no directamente en los tubos del [place].   Si su medicamento es muy caro, por favor, pngase en contacto con Rolm Gala llamando al [phone] y presione la opcin 4 o envenos un mensaje a travs de Clinical cytogeneticist.   No podemos decirle cul ser su copago por los medicamentos por adelantado ya que esto es diferente dependiendo de la cobertura de su seguro. Sin embargo, es posible que podamos encontrar un medicamento sustituto a Audiological scientist un formulario para que el seguro cubra el medicamento que se considera necesario.   Si se requiere una autorizacin previa para que su compaa de seguros [place] su medicamento, por favor permtanos de 1 a 2 das hbiles para completar [address]  Los precios de los medicamentos varan con frecuencia dependiendo del Environmental consultant de dnde se surte la receta y alguna farmacias pueden ofrecer precios ms baratos.  El sitio web www.goodrx.com tiene cupones para medicamentos de Health and safety inspector. Los precios aqu no tienen en cuenta lo que podra costar con la ayuda del seguro (puede ser ms barato con su seguro), pero el sitio web puede darle el precio si no utiliz Tourist information centre manager.  - Puede imprimir el cupn correspondiente y llevarlo con su receta a la farmacia.  - Tambin puede pasar por nuestra oficina durante el horario de atencin regular y Education officer, museum una tarjeta de cupones de GoodRx.  - Si necesita que su receta se enve electrnicamente a una farmacia diferente, informe a nuestra oficina a travs de MyChart de [place] o por telfono llamando al [phone] y presione la opcin 4.

## 2023-07-17 IMAGING — MG MM DIGITAL SCREENING BILAT W/ TOMO AND CAD
6 of 10 series · 6 of 30 positions shown · non-contrast
Comparison: Previous exam(s).

CLINICAL DATA: Screening.

EXAM:
DIGITAL SCREENING BILATERAL MAMMOGRAM WITH TOMOSYNTHESIS AND CAD
TECHNIQUE: Bilateral screening digital craniocaudal and mediolateral oblique
mammograms were obtained. Bilateral screening digital breast
tomosynthesis was performed. The images were evaluated with
computer-aided detection.

[L MLO synth-2D]
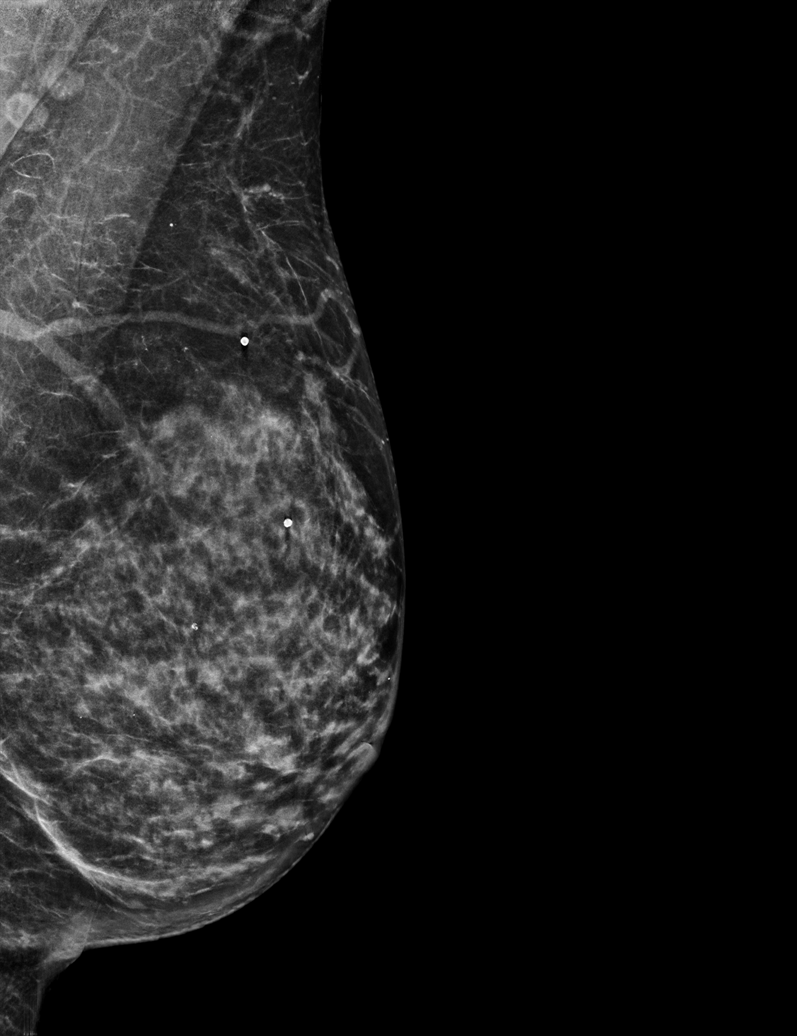

[R CV synth-2D]
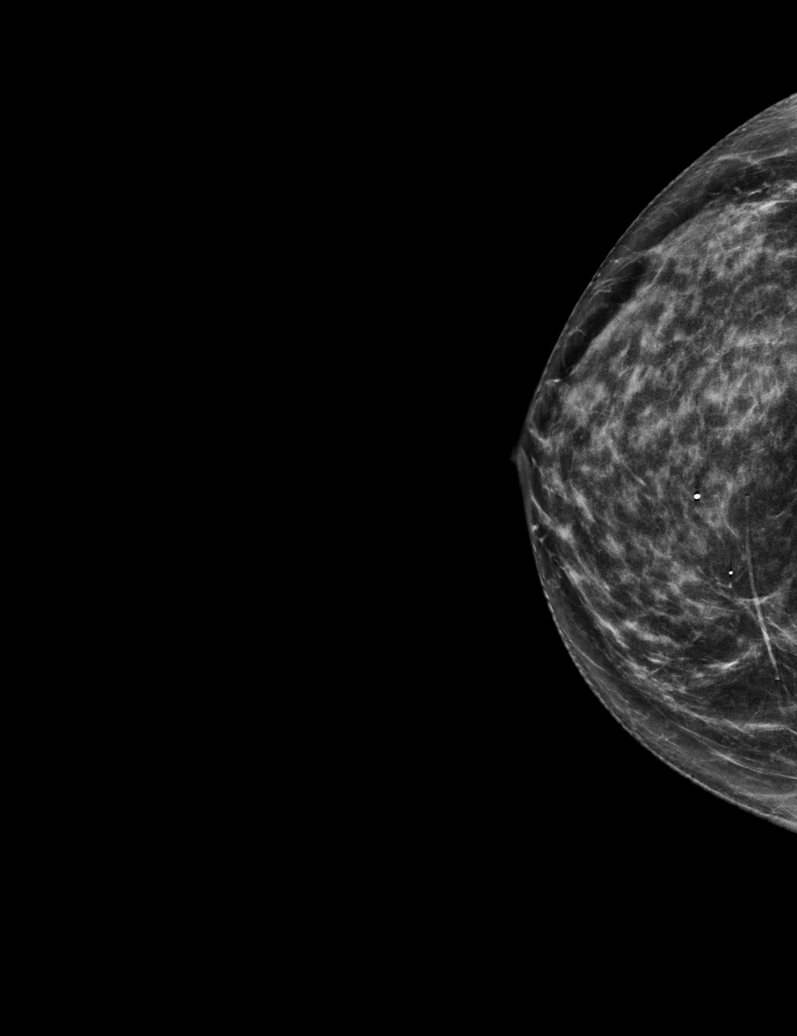

[R CC synth-2D]
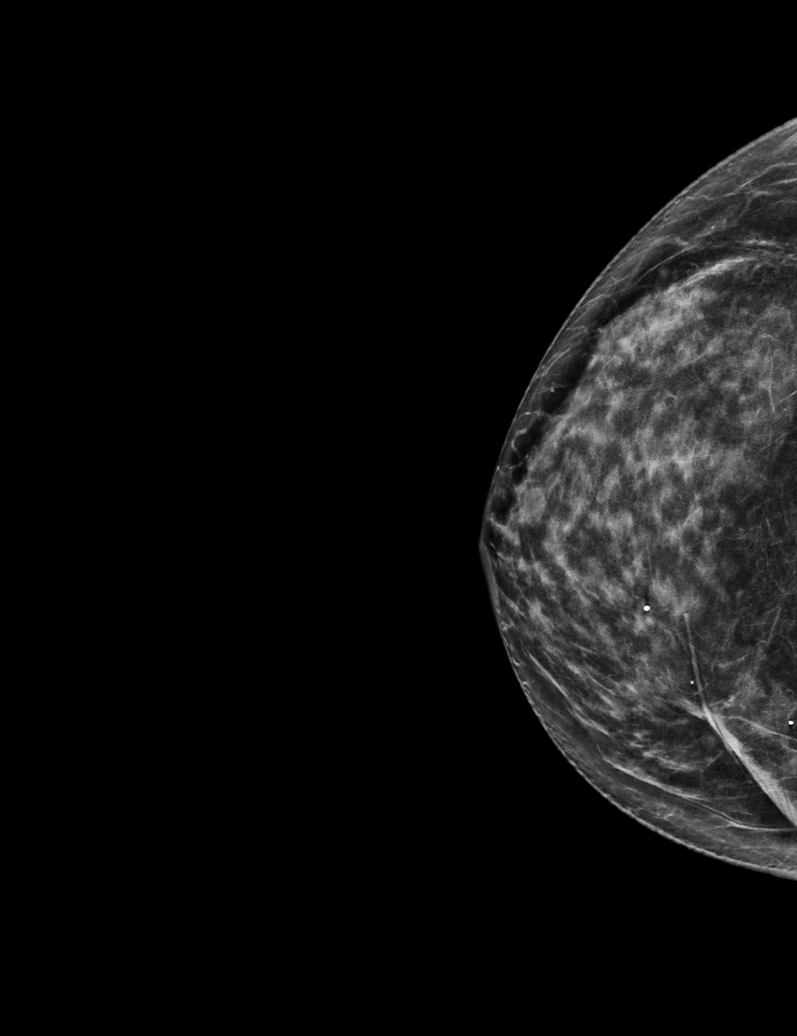

[R MLO synth-2D]
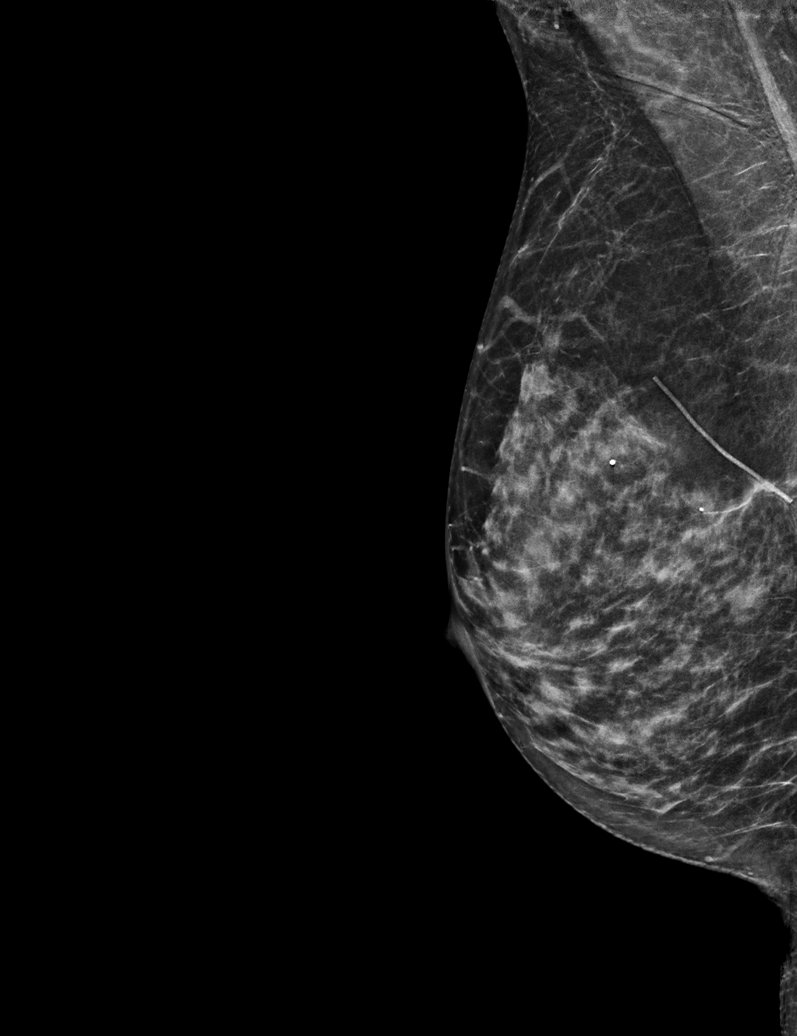

[L CC synth-2D]
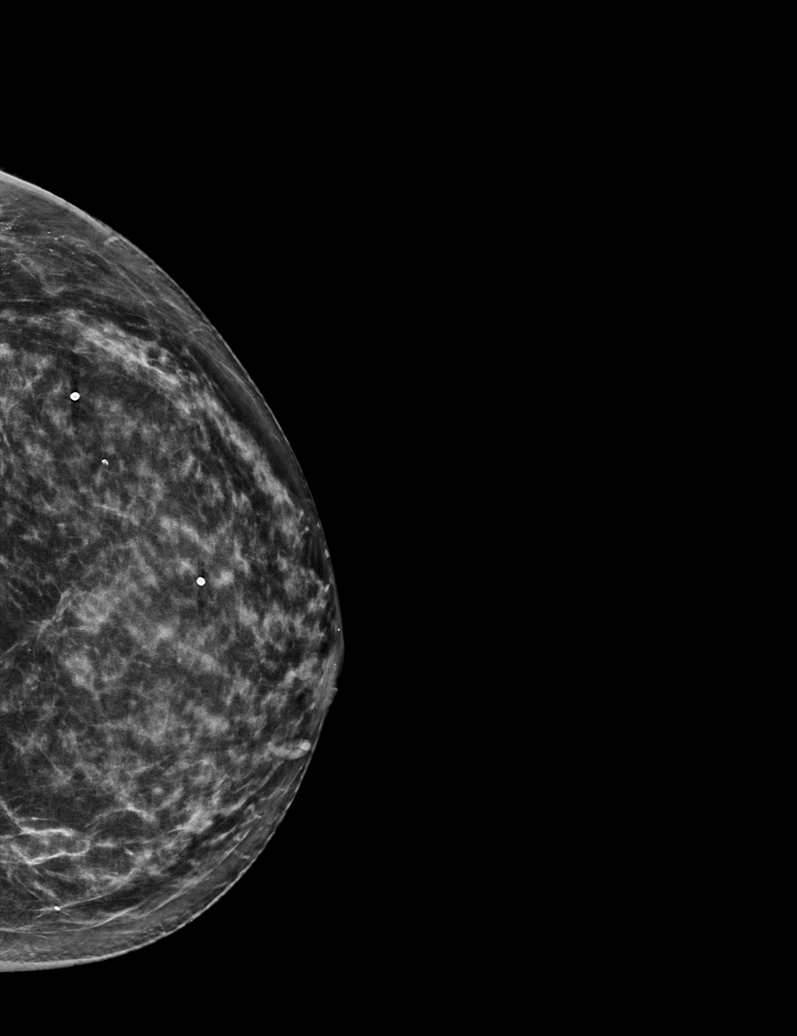

[L MLO tomo · tomo slice 31/61.0]
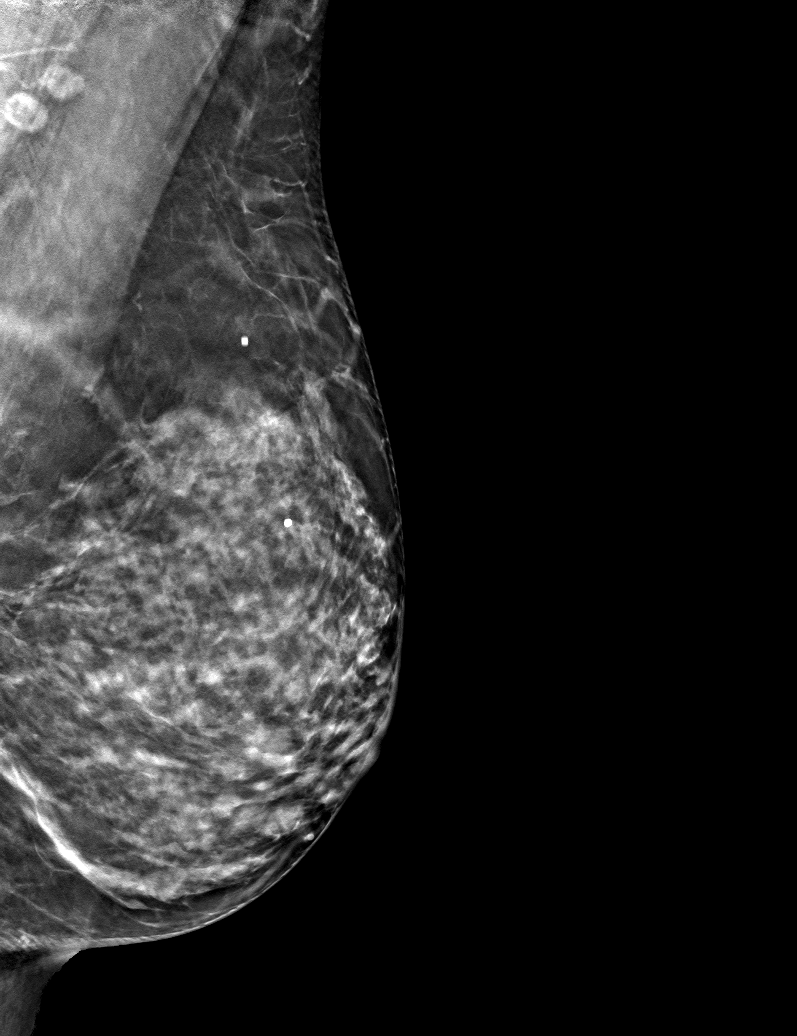

[6 of 30 positions shown; findings below may reference images not displayed]

ACR Breast Density Category d: The breast tissue is extremely dense,
which lowers the sensitivity of mammography.
FINDINGS: In the left breast, calcifications warrant further evaluation. In
the right breast, no findings suspicious for malignancy.
IMPRESSION: Further evaluation is suggested for calcifications in the left
breast.

RECOMMENDATION:
Diagnostic mammogram of the left breast. (Code:[id number])

The patient will be contacted regarding the findings, and additional
imaging will be scheduled.

BI-RADS CATEGORY  0: Incomplete. Need additional imaging evaluation
and/or prior mammograms for comparison.

## 2024-01-12 ENCOUNTER — Ambulatory Visit: Payer: BC Managed Care – PPO | Admitting: Dermatology
